# Patient Record
Sex: Male | Born: 1985 | Race: White | Hispanic: No | Marital: Married | Smoking: Former smoker
Health system: Southern US, Community
[De-identification: ages and names within clinical notes are randomized; demographics above are authoritative.]

## PROBLEM LIST (undated history)

## (undated) DIAGNOSIS — F4310 Post-traumatic stress disorder, unspecified: Secondary | ICD-10-CM

## (undated) HISTORY — DX: Post-traumatic stress disorder, unspecified: F43.10

---
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
## 2006-03-16 ENCOUNTER — Ambulatory Visit (HOSPITAL_BASED_OUTPATIENT_CLINIC_OR_DEPARTMENT_OTHER): Admission: RE | Admit: 2006-03-16 | Discharge: 2006-03-16 | Payer: Self-pay | Admitting: Orthopedic Surgery

## 2016-01-31 ENCOUNTER — Emergency Department
Admission: EM | Admit: 2016-01-31 | Discharge: 2016-01-31 | Disposition: A | Discharge status: Home / Self Care | Payer: 59 | Attending: Emergency Medicine | Admitting: Emergency Medicine

## 2016-01-31 ENCOUNTER — Emergency Department: Payer: 59

## 2016-01-31 DIAGNOSIS — W090XXA Fall on or from playground slide, initial encounter: Secondary | ICD-10-CM | POA: Insufficient documentation

## 2016-01-31 DIAGNOSIS — Y9389 Activity, other specified: Secondary | ICD-10-CM | POA: Insufficient documentation

## 2016-01-31 DIAGNOSIS — Y929 Unspecified place or not applicable: Secondary | ICD-10-CM | POA: Diagnosis not present

## 2016-01-31 DIAGNOSIS — Y999 Unspecified external cause status: Secondary | ICD-10-CM | POA: Insufficient documentation

## 2016-01-31 DIAGNOSIS — S6392XA Sprain of unspecified part of left wrist and hand, initial encounter: Secondary | ICD-10-CM | POA: Insufficient documentation

## 2016-01-31 DIAGNOSIS — S6992XA Unspecified injury of left wrist, hand and finger(s), initial encounter: Secondary | ICD-10-CM | POA: Diagnosis present

## 2016-01-31 MED ORDER — OXYCODONE-ACETAMINOPHEN 5-325 MG PO TABS: 1.0000 | ORAL_TABLET | Freq: Four times a day (QID) | ORAL | Status: AC | PRN

## 2016-01-31 MED ORDER — IBUPROFEN 800 MG PO TABS: 800.0000 mg | ORAL_TABLET | Freq: Three times a day (TID) | ORAL | Status: DC | PRN

## 2016-01-31 NOTE — ED Notes (Signed)
Pt was playing on the slide and fell. Pain and swelling to left hand.

## 2016-01-31 NOTE — Discharge Instructions (Signed)
Intermetacarpal Sprain °The intermetacarpal ligaments run between the knuckles at the base of the fingers. These ligaments are vulnerable to sprain and injury in which the ligament becomes overstretched or torn. Intermetacarpal sprains are classified into 3 categories. Grade 1 sprains cause pain, but the tendon is not lengthened. Grade 2 sprains include a lengthened ligament, due to the ligament being stretched or partially ruptured. With grade 2 sprains there is still function, although function may be decreased. Grade 3 sprains include a complete tear of the ligament, and the joint usually displays a loss of function.  °SYMPTOMS  °· Severe pain at the time of injury. °· Often, a feeling of popping or tearing inside the hand. °· Tenderness and inflammation at the knuckles. °· Bruising within a couple days of injury. °· Impaired ability to use the hand. °CAUSES  °This condition occurs when the intermetacarpal ligaments are subjected to a greater stress than they can handle. This causes the ligaments to become stretched or torn. °RISK INCREASES WITH: °· Previous hand injury. °· Fighting sports (boxing, wrestling, martial arts). °· Sports in which you could fall on an outstretched hand (soccer, basketball, volleyball). °· Other sports with repeated hand trauma (water polo, gymnastics). °· Poor hand strength and flexibility. °· Inadequate or poorly fitted protective equipment. °PREVENTION  °· Warm up and stretch properly before activity. °· Maintain appropriate conditioning: °¨ Hand flexibility. °¨ Muscle strength and endurance. °· Applying tape, protective strapping, or a brace may help prevent injury. °· Provide the hand with support during sports and practice activities for 6 to 12 months following injury. °PROGNOSIS  °With proper treatment, healing should occur without impairment. The length of healing varies from 2 to 12 weeks, depending on the severity of injury. °RELATED COMPLICATIONS  °· Longer healing time, if  activities are resumed too soon. °· Recurring symptoms or repeated injury, resulting in a chronic problem. °· Injury to other nearby structures (bone, cartilage, tendon). °· Arthritis of the knuckle (intermetacarpal) joint, with repeated sprains. °· Prolonged disability (sometimes). °· Hand and finger stiffness or weakness. °TREATMENT °Treatment first involves ice and medicine to reduce pain and inflammation. An elastic compression bandage may be worn to reduce discomfort and to protect the area. Depending on the severity of injury, you may be required to restrain the area with a cast, splint, or brace. After the ligament has been allowed to heal, strengthening and stretching exercises may be needed to regain strength and a full range of motion. Exercises may be completed at home or with a therapist. Surgery is rarely needed. °MEDICATION  °· If pain medicine is needed, nonsteroidal anti-inflammatory medicines (aspirin and ibuprofen), or other minor pain relievers (acetaminophen), are often advised. °· Do not take pain medicine for 7 days before surgery. °· Stronger pain relievers may be prescribed if your caregiver thinks they are needed. Use only as directed and only as much as you need. °HEAT AND COLD °· Cold treatment (icing) should be applied for 10 to 15 minutes every 2 to 3 hours for inflammation and pain, and immediately after activity that aggravates your symptoms. Use ice packs or an ice massage. °· Heat treatment may be used before performing stretching and strengthening activities prescribed by your caregiver, physical therapist, or athletic trainer. Use a heat pack or a warm water soak. °SEEK MEDICAL CARE IF:  °· Symptoms remain or get worse, despite treatment for longer than 2 to 4 weeks. °· You experience pain, numbness, discoloration, or coldness in the hand or fingers. °·   You develop blue, gray, or dark fingernails.  Any of the following occur after surgery: increased pain, swelling, redness,  drainage of fluids, bleeding in the affected area, or signs of infection, including fever.  New, unexplained symptoms develop. (Drugs used in treatment may produce side effects.)   This information is not intended to replace advice given to you by your health care provider. Make sure you discuss any questions you have with your health care provider.   Document Released: 08/29/2005 Document Revised: 09/19/2014 Document Reviewed: 02/16/2015 Elsevier Interactive Patient Education 2016 [employer]   Continue ice, ibuprofen. Use pain medicine as needed. Follow-up with orthopedics this week for further evaluation. Return to emergency room for any concerns.

## 2016-01-31 NOTE — ED Provider Notes (Signed)
[facility] Emergency Department Provider Note  ____________________________________________  Time seen: Approximately 6:57 PM  I have reviewed the triage vital signs and the nursing notes.   HISTORY  Chief Complaint Hand Pain    HPI Devin Riddle is a 30 y.o. male who fell on a slide landing on his left hand. Complains of left thumb and hand pain. Also swelling. Limited range of motion because of pain. Pain radiates up the wrist to the forearm.   No past medical history on file.  There are no active problems to display for this patient.   No past surgical history on file.  Current Outpatient Rx  Name  Route  Sig  Dispense  Refill  . ibuprofen (ADVIL,MOTRIN) 800 MG tablet   Oral   Take 1 tablet (800 mg total) by mouth every 8 (eight) hours as needed.   15 tablet   0   . oxyCODONE-acetaminophen (ROXICET) 5-325 MG tablet   Oral   Take 1 tablet by mouth every 6 (six) hours as needed.   20 tablet   0     Allergies Review of patient's allergies indicates no known allergies.  No family history on file.  Social History Social History  Substance Use Topics  . Smoking status: Not on file  . Smokeless tobacco: Not on file  . Alcohol Use: Not on file    Review of Systems Constitutional: No fever/chills Eyes: No visual changes. ENT: No sore throat. Cardiovascular: Denies chest pain. Respiratory: Denies shortness of breath. Gastrointestinal: No abdominal pain.  No nausea, no vomiting.  No diarrhea.  No constipation. Genitourinary: Negative for dysuria. Musculoskeletal: Negative for back pain. Skin: Negative for rash. Neurological: Negative for headaches, focal weakness or numbness. 10-point ROS otherwise negative.  ____________________________________________   PHYSICAL EXAM:  VITAL SIGNS: ED Triage Vitals  Enc Vitals Group     BP 01/31/16 1756 160/77 mmHg     Pulse Rate 01/31/16 1756 68     Resp 01/31/16 1756 20     Temp  01/31/16 1756 98.4 F (36.9 C)     Temp Source 01/31/16 1756 Oral     SpO2 01/31/16 1756 100 %     Weight 01/31/16 1756 175 lb (79.379 kg)     Height 01/31/16 1756 5\' 11"  (1.803 m)     Head Cir --      Peak Flow --      Pain Score 01/31/16 1757 7     Pain Loc --      Pain Edu? --      Excl. in GC? --     Constitutional: Alert and oriented. Well appearing and in no acute distress. Eyes: Conjunctivae are normal.  EOMI. Mouth/Throat: Mucous membranes are moist.   Neck:  Supple.   Respiratory: Normal respiratory effort.   Musculoskeletal: Left hand. Swelling of the thumb and thenar prominence. Tender over the first  second and third metacarpal bones. Motor sensory intact, but limited range of motion due to pain. Left wrist with normal range of motion. No snuffbox tenderness.  Neurologic:  Normal speech and language. No gross focal neurologic deficits are appreciated. No gait instability. Skin:  Skin is warm, dry and intact. No rash noted. Psychiatric: Mood and affect are normal. Speech and behavior are normal.  ____________________________________________   LABS (all labs ordered are listed, but only abnormal results are displayed)  Labs Reviewed - No data to display ____________________________________________  EKG   ____________________________________________  RADIOLOGY  CLINICAL DATA: Pain  after fall.  EXAM: LEFT HAND - COMPLETE 3+ VIEW  COMPARISON: None.  FINDINGS: There is no evidence of fracture or dislocation. There is no evidence of arthropathy or other focal bone abnormality. Soft tissues are unremarkable.  IMPRESSION: Negative.   Electronically Signed  By: Gerome Sam III M.D  On: 01/31/2016 18:25 ____________________________________________   PROCEDURES  Procedure(s) performed: None  Critical Care performed: No  ____________________________________________   INITIAL IMPRESSION / ASSESSMENT AND PLAN / ED COURSE  Pertinent  labs & imaging results that were available during my care of the patient were reviewed by me and considered in my medical decision making (see chart for details).  30 year old male with injury to the left hand and negative x-ray. Significant pain and swelling noted. Placed in a thumb spica splint and encouraged follow-up with orthopedics for further evaluation. Given ibuprofen, Percocet for pain. He will return to emergency room for any concerns. ____________________________________________   FINAL CLINICAL IMPRESSION(S) / ED DIAGNOSES  Final diagnoses:  Hand sprain, left, initial encounter      Ignacia Bayley, PA-C 01/31/16 1920  Ignacia Bayley, PA-C 01/31/16 1945  Emily Filbert, MD 01/31/16 2324

## 2019-02-26 ENCOUNTER — Ambulatory Visit: Payer: Self-pay | Admitting: Physician Assistant

## 2019-11-28 ENCOUNTER — Other Ambulatory Visit: Payer: Self-pay | Admitting: Orthopedic Surgery

## 2019-11-28 DIAGNOSIS — R2231 Localized swelling, mass and lump, right upper limb: Secondary | ICD-10-CM

## 2019-12-04 ENCOUNTER — Ambulatory Visit
Admission: RE | Admit: 2019-12-04 | Discharge: 2019-12-04 | Disposition: A | Discharge status: Home / Self Care | Payer: 59 | Source: Ambulatory Visit | Attending: Orthopedic Surgery | Admitting: Orthopedic Surgery

## 2019-12-04 DIAGNOSIS — R2231 Localized swelling, mass and lump, right upper limb: Secondary | ICD-10-CM

## 2020-03-29 ENCOUNTER — Encounter: Payer: Self-pay | Admitting: Emergency Medicine

## 2020-03-29 ENCOUNTER — Emergency Department
Admission: EM | Admit: 2020-03-29 | Discharge: 2020-03-29 | Disposition: A | Discharge status: Home / Self Care | Payer: 59 | Attending: Emergency Medicine | Admitting: Emergency Medicine

## 2020-03-29 ENCOUNTER — Emergency Department: Payer: 59

## 2020-03-29 ENCOUNTER — Other Ambulatory Visit: Payer: Self-pay

## 2020-03-29 DIAGNOSIS — L03211 Cellulitis of face: Secondary | ICD-10-CM | POA: Diagnosis not present

## 2020-03-29 DIAGNOSIS — K047 Periapical abscess without sinus: Secondary | ICD-10-CM | POA: Diagnosis not present

## 2020-03-29 DIAGNOSIS — R220 Localized swelling, mass and lump, head: Secondary | ICD-10-CM | POA: Diagnosis present

## 2020-03-29 DIAGNOSIS — Z87891 Personal history of nicotine dependence: Secondary | ICD-10-CM | POA: Diagnosis not present

## 2020-03-29 LAB — CBC WITH DIFFERENTIAL/PLATELET
Abs Immature Granulocytes: 0.02 10*3/uL (ref 0.00–0.07)
Basophils Absolute: 0 10*3/uL (ref 0.0–0.1)
Basophils Relative: 0 %
Eosinophils Absolute: 0.4 10*3/uL (ref 0.0–0.5)
Eosinophils Relative: 5 %
HCT: 49.8 % (ref 39.0–52.0)
Hemoglobin: 17.1 g/dL — ABNORMAL HIGH (ref 13.0–17.0)
Immature Granulocytes: 0 %
Lymphocytes Relative: 21 %
Lymphs Abs: 1.8 10*3/uL (ref 0.7–4.0)
MCH: 31.5 pg (ref 26.0–34.0)
MCHC: 34.3 g/dL (ref 30.0–36.0)
MCV: 91.7 fL (ref 80.0–100.0)
Monocytes Absolute: 0.8 10*3/uL (ref 0.1–1.0)
Monocytes Relative: 10 %
Neutro Abs: 5.3 10*3/uL (ref 1.7–7.7)
Neutrophils Relative %: 64 %
Platelets: 238 10*3/uL (ref 150–400)
RBC: 5.43 MIL/uL (ref 4.22–5.81)
RDW: 12.1 % (ref 11.5–15.5)
WBC: 8.3 10*3/uL (ref 4.0–10.5)
nRBC: 0 % (ref 0.0–0.2)

## 2020-03-29 LAB — COMPREHENSIVE METABOLIC PANEL
ALT: 35 U/L (ref 0–44)
AST: 22 U/L (ref 15–41)
Albumin: 4.7 g/dL (ref 3.5–5.0)
Alkaline Phosphatase: 76 U/L (ref 38–126)
Anion gap: 6 (ref 5–15)
BUN: 12 mg/dL (ref 6–20)
CO2: 28 mmol/L (ref 22–32)
Calcium: 9.1 mg/dL (ref 8.9–10.3)
Chloride: 104 mmol/L (ref 98–111)
Creatinine, Ser: 1.3 mg/dL — ABNORMAL HIGH (ref 0.61–1.24)
GFR calc Af Amer: >60 mL/min (ref >60)
GFR calc non Af Amer: >60 mL/min (ref >60)
Glucose, Bld: 101 mg/dL — ABNORMAL HIGH (ref 70–99)
Potassium: 4.2 mmol/L (ref 3.5–5.1)
Sodium: 138 mmol/L (ref 135–145)
Total Bilirubin: 2.8 mg/dL — ABNORMAL HIGH (ref 0.3–1.2)
Total Protein: 7.5 g/dL (ref 6.5–8.1)

## 2020-03-29 LAB — LACTIC ACID, PLASMA: Lactic Acid, Venous: 1.5 mmol/L (ref 0.5–1.9)

## 2020-03-29 MED ORDER — CLINDAMYCIN PHOSPHATE 600 MG/50ML IV SOLN
600.0000 mg | Freq: Once | INTRAVENOUS | Status: DC
  Filled 2020-03-29: qty 50

## 2020-03-29 MED ORDER — AMPICILLIN-SULBACTAM SODIUM 3 (2-1) G IJ SOLR
3.0000 g | Freq: Once | INTRAMUSCULAR | Status: AC
  Administered 2020-03-29: 3 g via INTRAVENOUS
  Filled 2020-03-29: qty 8

## 2020-03-29 MED ORDER — IOHEXOL 300 MG/ML  SOLN
75.0000 mL | Freq: Once | INTRAMUSCULAR | Status: AC | PRN
  Administered 2020-03-29: 75 mL via INTRAVENOUS

## 2020-03-29 MED ORDER — TRAMADOL HCL 50 MG PO TABS: 50.0000 mg | ORAL_TABLET | Freq: Four times a day (QID) | ORAL | 0 refills | Status: AC | PRN

## 2020-03-29 MED ORDER — IBUPROFEN 600 MG PO TABS: 600.0000 mg | ORAL_TABLET | Freq: Four times a day (QID) | ORAL | 0 refills | Status: AC | PRN

## 2020-03-29 MED ORDER — KETOROLAC TROMETHAMINE 30 MG/ML IJ SOLN
30.0000 mg | Freq: Once | INTRAMUSCULAR | Status: AC
  Administered 2020-03-29: 30 mg via INTRAVENOUS
  Filled 2020-03-29: qty 1

## 2020-03-29 MED ORDER — DEXAMETHASONE SODIUM PHOSPHATE 10 MG/ML IJ SOLN
10.0000 mg | Freq: Once | INTRAMUSCULAR | Status: AC
  Administered 2020-03-29: 10 mg via INTRAVENOUS
  Filled 2020-03-29: qty 1

## 2020-03-29 MED ORDER — CLINDAMYCIN HCL 300 MG PO CAPS: 300.0000 mg | ORAL_CAPSULE | Freq: Three times a day (TID) | ORAL | 0 refills | Status: AC

## 2020-03-29 NOTE — ED Notes (Signed)
Report given to Jennifer RN

## 2020-03-29 NOTE — Discharge Instructions (Signed)
STOP the amoxicillin and start taking the clindamycin prescribed today.  You may take ibuprofen for pain, with the tramadol as needed for more severe pain.  You have a 1cm abscess (collection of pus) adjacent to the painful tooth, with cellulitis (skin/soft tissue infection) to the face.  You will need to follow up with the dentist tomorrow or within 2 days to reassess as you likely still will need drainage and/or a root canal soon.  Return to the ER immediately for new, worsening, or persistent severe pain, swelling, fever, weakness, difficulty swallowing or breathing, or any other new or worsening symptoms that concern you.

## 2020-03-29 NOTE — ED Notes (Signed)
Pt states that he had a tooth to crack 2 months ago and then this week he started with facial swelling to the left side of face that extends into jaw region and orbital region  Pt in no respiratory distress at this time

## 2020-03-29 NOTE — ED Provider Notes (Signed)
[facility] Emergency Department Provider Note ____________________________________________   First MD Initiated Contact with Patient 03/29/20 [phone]     (approximate)  I have reviewed the triage vital signs and the nursing notes.   HISTORY  Chief Complaint Facial Swelling    HPI Devin Riddle is a 34 y.o. male with a past history of PTSD who presents with left-sided facial swelling since last night, relatively acute onset, and associated with some swelling and pain going down into his neck.  Patient states that initially he started having dental pain early yesterday morning.  He has a decaying tooth which was planned for root canal next month.  He went to the dentist yesterday and was started on amoxicillin, but states he has taken 3 doses and the swelling has gotten worse.  He reports some subjective fever/chills yesterday.  He denies any difficulty swallowing or breathing.  History reviewed. No pertinent past medical history.  There are no problems to display for this patient.   History reviewed. No pertinent surgical history.  Prior to Admission medications   Medication Sig Start Date End Date Taking? Authorizing Provider  clindamycin (CLEOCIN) 300 MG capsule Take 1 capsule (300 mg total) by mouth 3 (three) times daily for 10 days. 03/29/20 04/08/20  Dionne Bucy, MD  ibuprofen (ADVIL) 600 MG tablet Take 1 tablet (600 mg total) by mouth every 6 (six) hours as needed. 03/29/20   Dionne Bucy, MD  oxyCODONE-acetaminophen (ROXICET) 5-325 MG tablet Take 1 tablet by mouth every 6 (six) hours as needed. 01/31/16   Ignacia Bayley, PA-C  traMADol (ULTRAM) 50 MG tablet Take 1 tablet (50 mg total) by mouth every 6 (six) hours as needed for up to 5 days for severe pain. 03/29/20 04/03/20  Dionne Bucy, MD    Allergies Patient has no known allergies.  No family history on file.  Social History Social History   Tobacco Use  . Smoking status:  Former Games developer  . Smokeless tobacco: Current User  Substance Use Topics  . Alcohol use: Not on file  . Drug use: Not on file    Review of Systems  Constitutional: Positive for resolved fever/chills. Eyes: No redness. ENT: No sore throat. Cardiovascular: Denies chest pain. Respiratory: Denies shortness of breath. Gastrointestinal: No vomiting. Genitourinary: Negative for flank pain. Musculoskeletal: Negative for back pain. Skin: Negative for rash. Neurological: Negative for headache.   ____________________________________________   PHYSICAL EXAM:  VITAL SIGNS: ED Triage Vitals  Enc Vitals Group     BP 03/29/20 0328 (!) 146/95     Pulse Rate 03/29/20 0328 89     Resp 03/29/20 0328 18     Temp 03/29/20 0330 98.6 F (37 C)     Temp Source 03/29/20 0330 Oral     SpO2 03/29/20 0328 99 %     Weight 03/29/20 0330 190 lb (86.2 kg)     Height 03/29/20 0330 6' (1.829 m)     Head Circumference --      Peak Flow --      Pain Score 03/29/20 0329 7     Pain Loc --      Pain Edu? --      Excl. in GC? --     Constitutional: Alert and oriented. Well appearing and in no acute distress. Eyes: Conjunctivae are normal.  EOMI. Head: Atraumatic.  Left maxillofacial mild swelling extending up to the infraorbital region. Nose: No congestion/rhinnorhea. Mouth/Throat: Mucous membranes are moist.  Oropharynx clear with no erythema  or exudate.  Airway patent.  No pooled secretions. Neck: Normal range of motion.  No significant swelling.  No lymphadenopathy. Cardiovascular: Normal rate, regular rhythm.  Good peripheral circulation. Respiratory: Normal respiratory effort.  No retractions.  Gastrointestinal: No distention.  Musculoskeletal: Extremities warm and well perfused.  Neurologic:  Normal speech and language. No gross focal neurologic deficits are appreciated.  Skin:  Skin is warm and dry. No rash noted. Psychiatric: Mood and affect are normal. Speech and behavior are  normal.  ____________________________________________   LABS (all labs ordered are listed, but only abnormal results are displayed)  Labs Reviewed  COMPREHENSIVE METABOLIC PANEL - Abnormal; Notable for the following components:      Result Value   Glucose, Bld 101 (*)    Creatinine, Ser 1.30 (*)    Total Bilirubin 2.8 (*)    All other components within normal limits  CBC WITH DIFFERENTIAL/PLATELET - Abnormal; Notable for the following components:   Hemoglobin 17.1 (*)    All other components within normal limits  LACTIC ACID, PLASMA   ____________________________________________  EKG   ____________________________________________  RADIOLOGY  CT maxillofacial and neck soft tissue: Facial cellulitis.  1 cm subperiosteal abscess adjacent to the left upper lateral incisor.  ____________________________________________   PROCEDURES  Procedure(s) performed: No  Procedures  Critical Care performed: No ____________________________________________   INITIAL IMPRESSION / ASSESSMENT AND PLAN / ED COURSE  Pertinent labs & imaging results that were available during my care of the patient were reviewed by me and considered in my medical decision making (see chart for details).  34 year old male with PMH as noted above presents with left sided facial swelling and pain which he suspects is from a dental source.  He initially had dental pain yesterday, saw the dentist, and was started on amoxicillin, but then states that the swelling has spread almost up to his eye.  He denies shortness of breath or difficulty swallowing.  On exam, the patient is overall well-appearing.  His vital signs are normal.  He does have some swelling to the left maxillary area going up to the infraorbital region but no significant intraoral findings, purulent drainage, buccal swelling, or obvious abscess.  There is no swelling to the neck, although the patient does feel some discomfort and tightness  there.  Lab work-up is reassuring, with normal WBC count and lactate.  There is no evidence of sepsis.  Overall presentation is consistent with facial cellulitis likely from odontogenic source.  I will obtain a CT maxillofacial and neck soft tissue to evaluate for abscess or deep soft tissue infection.  If this is negative I will likely give a dose of IV clindamycin and discharged home with the same.  ----------------------------------------- 11:09 AM on 03/29/2020 -----------------------------------------  CT shows left-sided facial cellulitis, periapical infection and a 1 cm subperiosteal abscess adjacent to the left upper lateral incisor.  I discussed the case with Dr. Elenore Rota from ENT.  He recommended giving a dose of IV Unasyn in the ED but agreed that given the patient's well appearance, reassuring vital signs and lab work-up, as well as the small size of the abscess, he is appropriate for close outpatient follow-up for abscess drainage and root canal.  He agreed with discharging the patient with p.o. clindamycin, and also recommended giving a dose of Decadron in the ED.  On reassessment, the patient reports improved pain after Toradol.  He is comfortable and stable for discharge home.  I counseled him on the results of the work-up, ENT recommendations,  and the importance of maintaining close follow-up with the dentist.  He states that he will be able to go back to the dentist tomorrow.  Return precautions given, and he expresses understanding.   ____________________________________________   FINAL CLINICAL IMPRESSION(S) / ED DIAGNOSES  Final diagnoses:  Dental abscess  Facial cellulitis      NEW MEDICATIONS STARTED DURING THIS VISIT:  New Prescriptions   CLINDAMYCIN (CLEOCIN) 300 MG CAPSULE    Take 1 capsule (300 mg total) by mouth 3 (three) times daily for 10 days.   IBUPROFEN (ADVIL) 600 MG TABLET    Take 1 tablet (600 mg total) by mouth every 6 (six) hours as needed.    TRAMADOL (ULTRAM) 50 MG TABLET    Take 1 tablet (50 mg total) by mouth every 6 (six) hours as needed for up to 5 days for severe pain.     Note:  This document was prepared using Dragon voice recognition software and may include unintentional dictation errors.    Dionne Bucy, MD 03/29/20 [phone]

## 2020-03-29 NOTE — ED Triage Notes (Signed)
Patient states that he started having dental pain with swelling Saturday morning about 03:00. Patient states that he saw his dentist yesterday and was started on amoxicillin. Patient was told that if the swelling become worse to come to the ED. Patient states that the swelling has become worse.

## 2021-02-18 ENCOUNTER — Emergency Department: Payer: 59

## 2021-02-18 ENCOUNTER — Encounter: Payer: Self-pay | Admitting: Emergency Medicine

## 2021-02-18 ENCOUNTER — Emergency Department
Admission: EM | Admit: 2021-02-18 | Discharge: 2021-02-18 | Disposition: A | Discharge status: Home / Self Care | Payer: 59 | Attending: Emergency Medicine | Admitting: Emergency Medicine

## 2021-02-18 ENCOUNTER — Other Ambulatory Visit: Payer: Self-pay

## 2021-02-18 DIAGNOSIS — Y92410 Unspecified street and highway as the place of occurrence of the external cause: Secondary | ICD-10-CM | POA: Diagnosis not present

## 2021-02-18 DIAGNOSIS — S60511A Abrasion of right hand, initial encounter: Secondary | ICD-10-CM | POA: Diagnosis not present

## 2021-02-18 DIAGNOSIS — M25561 Pain in right knee: Secondary | ICD-10-CM | POA: Diagnosis not present

## 2021-02-18 DIAGNOSIS — Y9355 Activity, bike riding: Secondary | ICD-10-CM | POA: Diagnosis not present

## 2021-02-18 DIAGNOSIS — M25571 Pain in right ankle and joints of right foot: Secondary | ICD-10-CM | POA: Diagnosis not present

## 2021-02-18 DIAGNOSIS — S6991XA Unspecified injury of right wrist, hand and finger(s), initial encounter: Secondary | ICD-10-CM | POA: Diagnosis present

## 2021-02-18 DIAGNOSIS — S60512A Abrasion of left hand, initial encounter: Secondary | ICD-10-CM | POA: Diagnosis not present

## 2021-02-18 DIAGNOSIS — Z87891 Personal history of nicotine dependence: Secondary | ICD-10-CM | POA: Diagnosis not present

## 2021-02-18 DIAGNOSIS — S50312A Abrasion of left elbow, initial encounter: Secondary | ICD-10-CM | POA: Insufficient documentation

## 2021-02-18 DIAGNOSIS — M25551 Pain in right hip: Secondary | ICD-10-CM | POA: Diagnosis not present

## 2021-02-18 MED ORDER — MORPHINE SULFATE (PF) 4 MG/ML IV SOLN
4.0000 mg | INTRAVENOUS | Status: DC | PRN
  Administered 2021-02-18: 4 mg via INTRAVENOUS
  Filled 2021-02-18: qty 1

## 2021-02-18 MED ORDER — HYDROCODONE-ACETAMINOPHEN 5-325 MG PO TABS: 2.0000 | ORAL_TABLET | Freq: Four times a day (QID) | ORAL | 0 refills | Status: AC | PRN

## 2021-02-18 MED ORDER — KETOROLAC TROMETHAMINE 30 MG/ML IJ SOLN
30.0000 mg | Freq: Once | INTRAMUSCULAR | Status: AC
  Administered 2021-02-18: 30 mg via INTRAVENOUS
  Filled 2021-02-18: qty 1

## 2021-02-18 MED ORDER — IBUPROFEN 800 MG PO TABS: 800.0000 mg | ORAL_TABLET | Freq: Three times a day (TID) | ORAL | 0 refills | Status: AC | PRN
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
